# Patient Record
Sex: Male | Born: 1990 | Race: Asian | Hispanic: No | Marital: Single | Smoking: Never smoker
Health system: Southern US, Community
[De-identification: ages and names within clinical notes are randomized; demographics above are authoritative.]

---

## 2014-04-05 ENCOUNTER — Encounter (HOSPITAL_COMMUNITY): Payer: Self-pay | Admitting: Emergency Medicine

## 2014-04-05 ENCOUNTER — Emergency Department (HOSPITAL_COMMUNITY)
Admission: EM | Admit: 2014-04-05 | Discharge: 2014-04-05 | Disposition: A | Discharge status: Home / Self Care | Payer: Self-pay | Attending: Emergency Medicine | Admitting: Emergency Medicine

## 2014-04-05 ENCOUNTER — Emergency Department (HOSPITAL_COMMUNITY): Payer: Self-pay

## 2014-04-05 DIAGNOSIS — S61412A Laceration without foreign body of left hand, initial encounter: Secondary | ICD-10-CM | POA: Insufficient documentation

## 2014-04-05 DIAGNOSIS — Y9289 Other specified places as the place of occurrence of the external cause: Secondary | ICD-10-CM | POA: Insufficient documentation

## 2014-04-05 DIAGNOSIS — W25XXXA Contact with sharp glass, initial encounter: Secondary | ICD-10-CM | POA: Insufficient documentation

## 2014-04-05 DIAGNOSIS — Y9389 Activity, other specified: Secondary | ICD-10-CM | POA: Insufficient documentation

## 2014-04-05 DIAGNOSIS — Y998 Other external cause status: Secondary | ICD-10-CM | POA: Insufficient documentation

## 2014-04-05 LAB — I-STAT CHEM 8, ED
BUN: 8 mg/dL (ref 6–23)
CALCIUM ION: 1.26 mmol/L — AB (ref 1.12–1.23)
CHLORIDE: 102 meq/L (ref 96–112)
CREATININE: 1 mg/dL (ref 0.50–1.35)
GLUCOSE: 84 mg/dL (ref 70–99)
HCT: 47 % (ref 39.0–52.0)
Hemoglobin: 16 g/dL (ref 13.0–17.0)
Potassium: 4.3 mmol/L (ref 3.5–5.1)
Sodium: 139 mmol/L (ref 135–145)
TCO2: 23 mmol/L (ref 0–100)

## 2014-04-05 MED ORDER — HYDROCODONE-ACETAMINOPHEN 5-325 MG PO TABS: 1.0000 | ORAL_TABLET | Freq: Four times a day (QID) | ORAL | Status: AC | PRN

## 2014-04-05 MED ORDER — AMOXICILLIN-POT CLAVULANATE 875-125 MG PO TABS: 1.0000 | ORAL_TABLET | Freq: Two times a day (BID) | ORAL | Status: AC

## 2014-04-05 MED ORDER — LIDOCAINE HCL (PF) 1 % IJ SOLN
5.0000 mL | Freq: Once | INTRAMUSCULAR | Status: AC
Start: 2014-04-05 — End: 2014-04-05
  Administered 2014-04-05: 5 mL
  Filled 2014-04-05: qty 5

## 2014-04-05 NOTE — ED Notes (Signed)
PA at bedside to speak with patient.  

## 2014-04-05 NOTE — Discharge Instructions (Signed)
Keep wound and clean with mild soap and water. Keep area covered with a topical antibiotic ointment and bandage with slight pressure until the bleeding has completely subsided, keep bandage dry, and do not submerge in water for 24 hours. Do not go swimming until the wound closes. Ice and elevate for additional pain relief and swelling. Use percocet as directed as needed pain relief but don't drive while taking this medication. [facility] for wound recheck and with the hand specialist listed above in 7 days for suture removal and ongoing care of any further management of your hand injury. Take your antibiotics as directed. Monitor area for signs of infection to include, but not limited to: increasing pain, redness, drainage/pus, or swelling. Return to emergency department for emergent changing or worsening symptoms.    Laceration Care, Adult A laceration is a cut that goes through all layers of the skin. The cut goes into the tissue beneath the skin. HOME CARE For stitches (sutures) or staples:  Keep the cut clean and dry.  If you have a bandage (dressing), change it at least once a day. Change the bandage if it gets wet or dirty, or as told by your doctor.  Wash the cut with soap and water 2 times a day. Rinse the cut with water. Pat it dry with a clean towel.  Put a thin layer of medicated cream on the cut as told by your doctor.  You may shower after the first 24 hours. Do not soak the cut in water until the stitches are removed.  Only take medicines as told by your doctor.  Have your stitches or staples removed as told by your doctor. For skin adhesive strips:  Keep the cut clean and dry.  Do not get the strips wet. You may take a bath, but be careful to keep the cut dry.  If the cut gets wet, pat it dry with a clean towel.  The strips will fall off on their own. Do not remove the strips that are still stuck to the cut. For  wound glue:  You may shower or take baths. Do not soak or scrub the cut. Do not swim. Avoid heavy sweating until the glue falls off on its own. After a shower or bath, pat the cut dry with a clean towel.  Do not put medicine on your cut until the glue falls off.  If you have a bandage, do not put tape over the glue.  Avoid lots of sunlight or tanning lamps until the glue falls off. Put sunscreen on the cut for the first year to reduce your scar.  The glue will fall off on its own. Do not pick at the glue. You may need a tetanus shot if:  You cannot remember when you had your last tetanus shot.  You have never had a tetanus shot. If you need a tetanus shot and you choose not to have one, you may get tetanus. Sickness from tetanus can be serious. GET HELP RIGHT AWAY IF:   Your pain does not get better with medicine.  Your arm, hand, leg, or foot loses feeling (numbness) or changes color.  Your cut is bleeding.  Your joint feels weak, or you cannot use your joint.  You have painful lumps on your body.  Your cut is red, puffy (swollen), or painful.  You have a red line on the skin near the cut.  You have yellowish-white  fluid (pus) coming from the cut.  You have a fever.  You have a bad smell coming from the cut or bandage.  Your cut breaks open before or after stitches are removed.  You notice something coming out of the cut, such as wood or glass.  You cannot move a finger or toe. MAKE SURE YOU:   Understand these instructions.  Will watch your condition.  Will get help right away if you are not doing well or get worse. Document Released: 09/12/2007 Document Revised: 06/18/2011 Document Reviewed: 09/19/2010 [facility] Patient Information 2015 [place], [place]. This information is not intended to replace advice given to you by your health care provider. Make sure you discuss any questions you have with your health care provider.  Sutured Wound Care Sutures are  stitches that can be used to close wounds. Caring for your wound can help stop infection and lessen pain. HOME CARE   Rest and raise (elevate) the injured area until the pain and puffiness (swelling) go away.  Only take medicines as told by your doctor.  Clean the wound gently with mild soap and water once a day after the first 2 days. Rinse off the soap. Pat the area dry with a clean towel. Do not rub the wound.  Change the bandage (dressing) as told by your doctor. If the bandage sticks, soak it off with soapy water. Stop using a bandage after 2 days or after the wound stops leaking fluid.  Put cream on the wound as told by your doctor.  Do not stretch the wound.  Drink enough fluids to keep your pee (urine) clear or pale yellow.  See your doctor to have the sutures removed.  Use sunscreen or sunblock on the wound after it heals. GET HELP RIGHT AWAY IF:   Your wound gets red, puffy, hot, or tender.  You have more pain in the wound.  You have a red streak that goes away from the wound.  You see yellowish-white fluid (pus) coming out of the wound.  You have a fever.  You have chills and start to shake.  You notice a bad smell coming from the wound.  Your wound will not stop bleeding. MAKE SURE YOU:   Understand these instructions.  Will watch your condition.  Will get help right away if you are not doing well or get worse. Document Released: 09/12/2007 Document Revised: 06/18/2011 Document Reviewed: 07/30/2010 [facility] Patient Information 2015 [place], [place]. This information is not intended to replace advice given to you by your health care provider. Make sure you discuss any questions you have with your health care provider.  Wound Care Wound care helps prevent pain and infection.  You may need a tetanus shot if:  You cannot remember when you had your last tetanus shot.  You have never had a tetanus shot.  The injury broke your skin. If you need a tetanus  shot and you choose not to have one, you may get tetanus. Sickness from tetanus can be serious. HOME CARE   Only take medicine as told by your doctor.  Clean the wound daily with mild soap and water.  Change any bandages (dressings) as told by your doctor.  Put medicated cream and a bandage on the wound as told by your doctor.  Change the bandage if it gets wet, dirty, or starts to smell.  Take showers. Do not take baths, swim, or do anything that puts your wound under water.  Rest and raise (elevate) the wound  until the pain and puffiness (swelling) are better.  Keep all doctor visits as told. GET HELP RIGHT AWAY IF:   Yellowish-white fluid (pus) comes from the wound.  Medicine does not lessen your pain.  There is a red streak going away from the wound.  You have a fever. MAKE SURE YOU:   Understand these instructions.  Will watch your condition.  Will get help right away if you are not doing well or get worse. Document Released: 01/03/2008 Document Revised: 06/18/2011 Document Reviewed: 07/30/2010 [facility] Patient Information 2015 [place]. This information is not intended to replace advice given to you by your health care provider. Make sure you discuss any questions you have with your health care provider.

## 2014-04-05 NOTE — ED Notes (Signed)
PA at bedside to work on laceration.

## 2014-04-05 NOTE — ED Provider Notes (Signed)
CSN: [id number]     Arrival date & time 04/05/14  1105 History  This chart was scribed for non-physician practitioner, Allen DerryMercedes Camprubi-Soms, PA-C working with Merrie RoofJohn David Wofford III, MD by Greggory StallionKayla Andersen, ED scribe. This patient was seen in room TR11C/TR11C and the patient's care was started at 12:33 PM.    Chief Complaint  Patient presents with  . Extremity Laceration   Patient is a 23 y.o. male presenting with skin laceration. The history is provided by the patient. No language interpreter was used.  Laceration Location:  Hand Hand laceration location:  L hand Bleeding: venous and uncontrolled   Time since incident:  19 hours Laceration mechanism:  Broken glass Pain details:    Quality:  Unable to specify   Severity:  Mild   Timing:  Intermittent   Progression:  Unchanged Relieved by:  Pressure Worsened by:  Nothing tried Ineffective treatments:  None tried Tetanus status:  Up to date  HPI Comments: Angel Huang is a 23 y.o. male who presents to the Emergency Department complaining of a laceration to his left hand that occurred yesterday around 5 PM. States he was washing a glass when it broke in his hand. He has put pressure over the area but reports continued bleeding, states it's oozing. Reports mild pain over the area when pressure is released but he is unable to further specify the pain. Denies chest pain, SOB, dizziness, numbness, tingling, weakness. Denies history of bleeding conditions. Pt is not currently on blood thinners. His last tetanus was last year. Denies allergies to medications.  History reviewed. No pertinent past medical history. History reviewed. No pertinent past surgical history. No family history on file. History  Substance Use Topics  . Smoking status: Never Smoker   . Smokeless tobacco: Not on file  . Alcohol Use: No    Review of Systems  Respiratory: Negative for shortness of breath.   Cardiovascular: Negative for chest pain.  Musculoskeletal:  Negative for myalgias, joint swelling and arthralgias.  Skin: Positive for wound.  Neurological: Negative for dizziness, syncope, weakness, light-headedness and numbness.  Hematological: Does not bruise/bleed easily.  10 systems reviewed and are negative for acute changes except as noted in the HPI.  Allergies  Review of patient's allergies indicates no known allergies.  Home Medications   Prior to Admission medications   Not on File   BP 121/73 mmHg  Pulse 76  Temp(Src) 98.7 F (37.1 C) (Oral)  SpO2 100%   Physical Exam  Constitutional: He is oriented to person, place, and time. Vital signs are normal. He appears well-developed and well-nourished.  Non-toxic appearance. No distress.  VSS, well appearing  HENT:  Head: Normocephalic and atraumatic.  Mouth/Throat: Mucous membranes are normal.  Eyes: Conjunctivae and EOM are normal. Right eye exhibits no discharge. Left eye exhibits no discharge.  Neck: Normal range of motion. Neck supple.  Cardiovascular: Normal rate and intact distal pulses.   Distal pulses intact, cap refill brisk and present in all digits  Pulmonary/Chest: Effort normal. No respiratory distress.  Abdominal: Normal appearance. He exhibits no distension.  Musculoskeletal: Normal range of motion.       Left hand: He exhibits laceration. He exhibits normal range of motion, no tenderness, normal two-point discrimination and normal capillary refill. Normal sensation noted. Normal strength noted.       Hands: L hand with ~3cm lac just proximal to MCP joint of 2nd digit, extending to just overtop of the joint, with nonpulsatile oozing bleeding. ROM intact at  MCP, PIP, and DIP joints. Sensation grossly intact in all aspects of digits, wiggles all digits, cap refill brisk and present. Strength 5/5 in all extremities. Once tourniquet was applied, bleeding ceased and wound was explored, extends down to extensor tendon without interruption to tendon sheath. No FBs visualized.   Neurological: He is alert and oriented to person, place, and time. He has normal strength. No sensory deficit.  Skin: Skin is warm and dry. Laceration noted.  Left hand laceration as noted above  Psychiatric: He has a normal mood and affect. His behavior is normal.  Nursing note and vitals reviewed.   ED Course  LACERATION REPAIR Date/Time: 04/05/2014 2:28 PM Performed by: Marjean DonnaAMPRUBI-SOMS, Cira Deyoe STRUPP Authorized by: Ramond MarrowAMPRUBI-SOMS, Lenford Beddow STRUPP Consent: Verbal consent obtained. Risks and benefits: risks, benefits and alternatives were discussed Consent given by: patient Patient understanding: patient states understanding of the procedure being performed Patient consent: the patient's understanding of the procedure matches consent given Patient identity confirmed: verbally with patient Body area: upper extremity Location details: left hand Laceration length: 3 cm Foreign bodies: no foreign bodies Tendon involvement: none Nerve involvement: none Vascular damage: yes (venous) Anesthesia: local infiltration Local anesthetic: lidocaine 1% without epinephrine Anesthetic total: 1 ml Patient sedated: no Preparation: Patient was prepped and draped in the usual sterile fashion. Irrigation solution: saline Irrigation method: syringe Amount of cleaning: extensive Debridement: none Degree of undermining: none Skin closure: 4-0 Prolene Number of sutures: 4 Technique: simple Approximation: loose Approximation difficulty: simple Dressing: 4x4 sterile gauze Patient tolerance: Patient tolerated the procedure well with no immediate complications Comments: Loose closure of 3cm lac with 4 sutures to allow for improved hemostasis. 1L saline irrigation used. Wound bed explored extensively without retained FBs noted.   (including critical care time)  DIAGNOSTIC STUDIES: Oxygen Saturation is 100% on RA, normal by my interpretation.    COORDINATION OF CARE: 12:36 PM-Discussed treatment  plan which includes hand xray and cleaning wound with pt at bedside and pt agreed to plan. Advised pt lacerations are not normally repaired after this long.  12:38 PM-Discussed pt with Dr. Loretha StaplerWofford. Advised compression dressing and to re-evaluate after one hour. If wound is still bleeding, advised repair and antibiotics.   2:28 PM-Bleeding unable to be controlled with pressure. Will repair laceration at this time. 1 mL 1% lidocaine without epi used. 4-0 Prolene used to close wound with 4 loose sutures. Risks to suturing lacerations after this long were discussed with pt and he is agreeable to plan. Will discharge with an antibiotic.   Labs Review Labs Reviewed  I-STAT CHEM 8, ED - Abnormal; Notable for the following:    Calcium, Ion 1.26 (*)    All other components within normal limits    Imaging Review Dg Hand Complete Left  04/05/2014   CLINICAL DATA:  Left hand laceration. Question foreign body/glass. Initial encounter.  EXAM: LEFT HAND - COMPLETE 3+ VIEW  COMPARISON:  None.  FINDINGS: There is an overlying dressing in the region of the metacarpophalangeal joints. No radiopaque foreign body. No fracture. No erosion or periosteal reaction. The alignment is maintained.  IMPRESSION: No radiopaque foreign body or acute osseous abnormality.   Electronically Signed   By: Rubye OaksMelanie  Ehinger M.D.   On: 04/05/2014 13:06     EKG Interpretation None      MDM   Final diagnoses:  Hand laceration, left, initial encounter    23 y.o. male with laceration to L hand, volar aspect, approx 3cm in length, which occurred >12hrs ago but <  24hrs and is continuing to bleed. Pt states he's UTD on tetanus. Istat obtained to eval for anemia, which was neg. Obtained xray which showed no acute fracture or retained FB. Applied pressure dressing for ~25 minutes to attempt to control bleeding which failed. Proceeded with loose repair with tourniquet to help control bleeding while area was irrigated with 1L saline. Wound  extends down to extensor tendon but does not interrupt tendon. Loose repair with 4 sutures, wound hemostatic after this, but open enough still to allow for drainage. Started on abx. Pt declined pain meds here, stated he wasn't in pain. Given norco rx and augmentin. Will have him f/up with urgent care in 2 days for wound recheck given the high risk of infection in this wound due to time that it was open. Will have him f/up with hand surgeon in 1wk for suture removal and ongoing management of wound/injury. I explained the diagnosis and have given explicit precautions to return to the ER including for any other new or worsening symptoms. The patient understands and accepts the medical plan as it's been dictated and I have answered their questions. Discharge instructions concerning home care and prescriptions have been given. The patient is STABLE and is discharged to home in good condition.  BP 130/76 mmHg  Pulse 71  Temp(Src) 97.3 F (36.3 C) (Oral)  Resp 16  SpO2 100%  Meds ordered this encounter  Medications  . lidocaine (PF) (XYLOCAINE) 1 % injection 5 mL    Sig:   . HYDROcodone-acetaminophen (NORCO) 5-325 MG per tablet    Sig: Take 1 tablet by mouth every 6 (six) hours as needed for severe pain.    Dispense:  6 tablet    Refill:  0    Order Specific Question:  Supervising Provider    Answer:  Eber Hong D [[id number]]  . amoxicillin-clavulanate (AUGMENTIN) 875-125 MG per tablet    Sig: Take 1 tablet by mouth 2 (two) times daily. One po bid x 7 days    Dispense:  14 tablet    Refill:  0    Order Specific Question:  Supervising Provider    Answer:  Eber Hong D [[id number]]     I personally performed the services described in this documentation, which was scribed in my presence. The recorded information has been reviewed and is accurate.  Donnita Falls [place], [place] 04/05/14 1513  Merrie Roof, MD 04/05/14 [phone]

## 2014-04-05 NOTE — ED Notes (Signed)
Laceration to left hand with broken drinking glass. This happened yesterday around 1700. Wound continues to bleed when dressing removed.

## 2014-04-08 ENCOUNTER — Encounter (HOSPITAL_COMMUNITY): Payer: Self-pay | Admitting: Emergency Medicine

## 2014-04-08 ENCOUNTER — Emergency Department (HOSPITAL_COMMUNITY)
Admission: EM | Admit: 2014-04-08 | Discharge: 2014-04-08 | Disposition: A | Discharge status: Home / Self Care | Payer: Self-pay | Attending: Emergency Medicine | Admitting: Emergency Medicine

## 2014-04-08 DIAGNOSIS — Z4801 Encounter for change or removal of surgical wound dressing: Secondary | ICD-10-CM | POA: Insufficient documentation

## 2014-04-08 DIAGNOSIS — Z79899 Other long term (current) drug therapy: Secondary | ICD-10-CM | POA: Insufficient documentation

## 2014-04-08 DIAGNOSIS — Z5189 Encounter for other specified aftercare: Secondary | ICD-10-CM

## 2014-04-08 NOTE — Discharge Instructions (Signed)
Sutured Wound Care °Sutures are stitches that can be used to close wounds. Wound care helps prevent pain and infection.  °HOME CARE INSTRUCTIONS  °· Rest and elevate the injured area until all the pain and swelling are gone. °· Only take over-the-counter or prescription medicines for pain, discomfort, or fever as directed by your caregiver. °· After 48 hours, gently wash the area with mild soap and water once a day, or as directed. Rinse off the soap. Pat the area dry with a clean towel. Do not rub the wound. This may cause bleeding. °· Follow your caregiver's instructions for how often to change the bandage (dressing). Stop using a dressing after 2 days or after the wound stops draining. °· If the dressing sticks, moisten it with soapy water and gently remove it. °· Apply ointment on the wound as directed. °· Avoid stretching a sutured wound. °· Drink enough fluids to keep your urine clear or pale yellow. °· Follow up with your caregiver for suture removal as directed. °· Use sunscreen on your wound for the next 3 to 6 months so the scar will not darken. °SEEK IMMEDIATE MEDICAL CARE IF:  °· Your wound becomes red, swollen, hot, or tender. °· You have increasing pain in the wound. °· You have a red streak that extends from the wound. °· There is pus coming from the wound. °· You have a fever. °· You have shaking chills. °· There is a bad smell coming from the wound. °· You have persistent bleeding from the wound. °MAKE SURE YOU:  °· Understand these instructions. °· Will watch your condition. °· Will get help right away if you are not doing well or get worse. °Document Released: 05/03/2004 Document Revised: 06/18/2011 Document Reviewed: 07/30/2010 °Exit[employer]® Patient Information ©2015 [employer]. This information is not intended to replace advice given to you by your health care provider. Make sure you discuss any questions you have with your health care provider. ° °

## 2014-04-08 NOTE — ED Notes (Signed)
Here 12/26 for lac to left hand. Had been over 12 hours since accident, so were not able to suture. Had problem with continued bleeding so a few sutures were applied. Pt reports not further bleeding. Sutures intact, wound w/o signs of infection.

## 2014-04-08 NOTE — ED Provider Notes (Signed)
CSN: [id number]     Arrival date & time 04/08/14  0911 History   This chart was scribed for non-physician practitioner working with Rolland PorterMark James, MD, by Jarvis Morganaylor Ferguson, ED Scribe. This patient was seen in room TR06C/TR06C and the patient's care was started at 9:28 AM.     Chief Complaint  Patient presents with  . Wound Check    The history is provided by the patient. No language interpreter was used.    HPI Comments: Angel Huang is a 23 y.o. male who presents to the Emergency Department for a follow up of a laceration to his left hand just below his 2nd finger. He states he originally cut the area with broken glass. Pt presented to the ED 3 days ago and 4 sutures placed in his left hand. He was told to return in 2-3 days for wound recheck because he wound had to be sutured after 12 hours of being open due to the fact that they were unable to get it to stop bleeding. Pt denies any pain to the area. He denies any fever, nausea, vomiting, diarrhea, color change, or drainage.  History reviewed. No pertinent past medical history. History reviewed. No pertinent past surgical history. No family history on file. History  Substance Use Topics  . Smoking status: Never Smoker   . Smokeless tobacco: Not on file  . Alcohol Use: No    Review of Systems  Skin: Positive for wound (laceration with 4 sutures to left hand).  All other systems reviewed and are negative.     Allergies  Review of patient's allergies indicates no known allergies.  Home Medications   Prior to Admission medications   Medication Sig Start Date End Date Taking? Authorizing Provider  amoxicillin-clavulanate (AUGMENTIN) 875-125 MG per tablet Take 1 tablet by mouth 2 (two) times daily. One po bid x 7 days 04/05/14   Donnita FallsMercedes Strupp Camprubi-Soms, PA-C  HYDROcodone-acetaminophen (NORCO) 5-325 MG per tablet Take 1 tablet by mouth every 6 (six) hours as needed for severe pain. 04/05/14   Mercedes Strupp Camprubi-Soms, PA-C    Triage Vitals: BP 132/76 mmHg  Pulse 60  Temp(Src) 98.3 F (36.8 C) (Oral)  Resp 16  SpO2 98%  Physical Exam  Constitutional: He is oriented to person, place, and time. He appears well-developed and well-nourished. No distress.  HENT:  Head: Normocephalic and atraumatic.  Eyes: Conjunctivae and EOM are normal.  Neck: Neck supple. No tracheal deviation present.  Cardiovascular: Normal rate.   Pulmonary/Chest: Effort normal. No respiratory distress.  Musculoskeletal: Normal range of motion.       Hands: Neurological: He is alert and oriented to person, place, and time.  Skin: Skin is warm and dry.  Psychiatric: He has a normal mood and affect. His behavior is normal.  Nursing note and vitals reviewed.   ED Course  Procedures (including critical care time)  DIAGNOSTIC STUDIES: Oxygen Saturation is 98% on RA, normal by my interpretation.    COORDINATION OF CARE: 9:26 AM changed wound dressing. Wound appears to be healing well, no systemic symptoms. Normal Vitals. Sutures will need to be removed at day 7-10. They have been advised that Monday or Tuesday. Continue to keep wound dry and clean. Pt has not questions at this time.Pt advised of plan for treatment and pt agrees.  Labs Review Labs Reviewed - No data to display  Imaging Review No results found.   EKG Interpretation None     MDM   Final diagnoses:  Visit for  wound check   23 y.o.Angel Huang's evaluation in the Emergency Department is complete. It has been determined that no acute conditions requiring further emergency intervention are present at this time. The patient/guardian have been advised of the diagnosis and plan. We have discussed signs and symptoms that warrant return to the ED, such as changes or worsening in symptoms.  Vital signs are stable at discharge. Filed Vitals:   04/08/14 0920  BP: 132/76  Pulse: 60  Temp: 98.3 F (36.8 C)  Resp: 16    Patient/guardian has voiced understanding and  agreed to follow-up with the PCP or specialist.  I personally performed the services described in this documentation, which was scribed in my presence. The recorded information has been reviewed and is accurate.   Dorthula Matasiffany G Gelena Klosinski, PA-C 04/08/14 0932  Rolland PorterMark James, MD 04/10/14 [phone]

## 2014-04-12 ENCOUNTER — Encounter (HOSPITAL_COMMUNITY): Payer: Self-pay | Admitting: Cardiology

## 2014-04-12 ENCOUNTER — Emergency Department (HOSPITAL_COMMUNITY)
Admission: EM | Admit: 2014-04-12 | Discharge: 2014-04-12 | Disposition: A | Discharge status: Home / Self Care | Payer: Self-pay | Attending: Emergency Medicine | Admitting: Emergency Medicine

## 2014-04-12 DIAGNOSIS — Z4802 Encounter for removal of sutures: Secondary | ICD-10-CM | POA: Insufficient documentation

## 2014-04-12 DIAGNOSIS — Z79899 Other long term (current) drug therapy: Secondary | ICD-10-CM | POA: Insufficient documentation

## 2014-04-12 DIAGNOSIS — Z792 Long term (current) use of antibiotics: Secondary | ICD-10-CM | POA: Insufficient documentation

## 2014-04-12 NOTE — ED Notes (Signed)
Pt reports that he is here to have the sutures removed from his hand

## 2014-04-12 NOTE — Discharge Instructions (Signed)
Please follow the directions provided. Keep your wound clean and dry.  Don't hesitate to return for any new, worsening, or concerning symptoms.  SEEK MEDICAL CARE IF:  You have increasing redness, swelling, or pain in the wound.  You see pus coming from the wound.  You have a fever.  You notice a bad smell coming from the wound or dressing.  Your wound breaks open (edges not staying together).

## 2014-04-12 NOTE — ED Provider Notes (Signed)
CSN: [id number]     Arrival date & time 04/12/14  1310 History  This chart was scribed for non-physician practitioner, Donetta Potts, NP working with Geoffery Lyons, MD, by Jarvis Morgan, ED Scribe. This patient was seen in room TR08C/TR08C and the patient's care was started at 2:01 PM.    Chief Complaint  Patient presents with  . Suture / Staple Removal    The history is provided by the patient and a friend. No language interpreter was used.    HPI Comments: Angel Huang is a 24 y.o. male who presents to the Emergency Department for a follow up of a laceration to his left hand just below his 2nd finger. He states he originally cut the area with broken glass. Pt presented to the ED 5 days ago to have his stitches taken out but was told he needed to wait 5-6 more days before the 4 sutures could be removed. Pt denies any pain to the area. He denies any fever, nausea, vomiting, diarrhea, color change, or drainage.   History reviewed. No pertinent past medical history. History reviewed. No pertinent past surgical history. History reviewed. No pertinent family history. History  Substance Use Topics  . Smoking status: Never Smoker   . Smokeless tobacco: Not on file  . Alcohol Use: No    Review of Systems  Constitutional: Negative for fever.  Gastrointestinal: Negative for nausea, vomiting and diarrhea.  Skin: Positive for wound (well healed lac to left 2nd finger). Negative for color change.      Allergies  Review of patient's allergies indicates no known allergies.  Home Medications   Prior to Admission medications   Medication Sig Start Date End Date Taking? Authorizing Provider  amoxicillin-clavulanate (AUGMENTIN) 875-125 MG per tablet Take 1 tablet by mouth 2 (two) times daily. One po bid x 7 days 04/05/14   Donnita Falls Camprubi-Soms, PA-C  HYDROcodone-acetaminophen (NORCO) 5-325 MG per tablet Take 1 tablet by mouth every 6 (six) hours as needed for severe pain. 04/05/14    Mercedes Strupp Camprubi-Soms, PA-C   Triage Vitals: BP 130/74 mmHg  Pulse 83  Temp(Src) 98.2 F (36.8 C) (Oral)  Resp 18  Ht  (1.778 m)  SpO2 100%  Physical Exam  Constitutional: He is oriented to person, place, and time. He appears well-developed and well-nourished. No distress.  HENT:  Head: Normocephalic and atraumatic.  Eyes: Conjunctivae and EOM are normal.  Neck: Neck supple. No tracheal deviation present.  Cardiovascular: Normal rate.   Pulmonary/Chest: Effort normal. No respiratory distress.  Musculoskeletal: Normal range of motion.  Neurological: He is alert and oriented to person, place, and time.  Skin: Skin is warm and dry.  Well healed, well appromixated, wound to dorsal aspect left index finger at MCP. no redness, no drainage, with 4 stitches in place   Psychiatric: He has a normal mood and affect. His behavior is normal.  Nursing note and vitals reviewed.   ED Course  Procedures (including critical care time)  DIAGNOSTIC STUDIES: Oxygen Saturation is 100% on RA, normal by my interpretation.    COORDINATION OF CARE:  2:11 PM  SUTURE REMOVAL Performed by: Harle Battiest, NP Consent: Verbal consent obtained. Patient identity confirmed: provided demographic data Time out: Immediately prior to procedure a "time out" was called to verify the correct patient, procedure, equipment, support staff and site/side marked as required. Location: left index finger at MCP Wound Appearance: clean Sutures/Staples Removed: 4 Patient tolerance: Patient tolerated the procedure well with no immediate complications.  Labs Review Labs Reviewed - No data to display  Imaging Review No results found.   EKG Interpretation None      MDM   Final diagnoses:  Visit for suture removal   24 yo with well-healed laceration presenting for suture removal and wound check as above. Procedure tolerated well. Vitals normal, no signs of infection. Scar minimization &  return precautions given at dc.    I personally performed the services described in this documentation, which was scribed in my presence. The recorded information has been reviewed and is accurate.   Filed Vitals:   04/12/14 1321  BP: 130/74  Pulse: 83  Temp: 98.2 F (36.8 C)  TempSrc: Oral  Resp: 18  Height:  (1.778 m)  SpO2: 100%   Meds given in ED:  Medications - No data to display  Discharge Medication List as of 04/12/2014  2:14 PM       Harle Battiest, NP 04/12/14 1610  Geoffery Lyons, MD 04/13/14 1531

## 2014-04-12 NOTE — ED Notes (Signed)
Declined W/C at D/C and was escorted to lobby by RN. 

## 2015-05-22 IMAGING — DX DG HAND COMPLETE 3+V*L*
3 series · 3 of 3 positions shown · non-contrast
Comparison: None.

CLINICAL DATA: Left hand laceration. Question foreign body/glass.
Initial encounter.

EXAM:
LEFT HAND - COMPLETE 3+ VIEW

[hand pa]
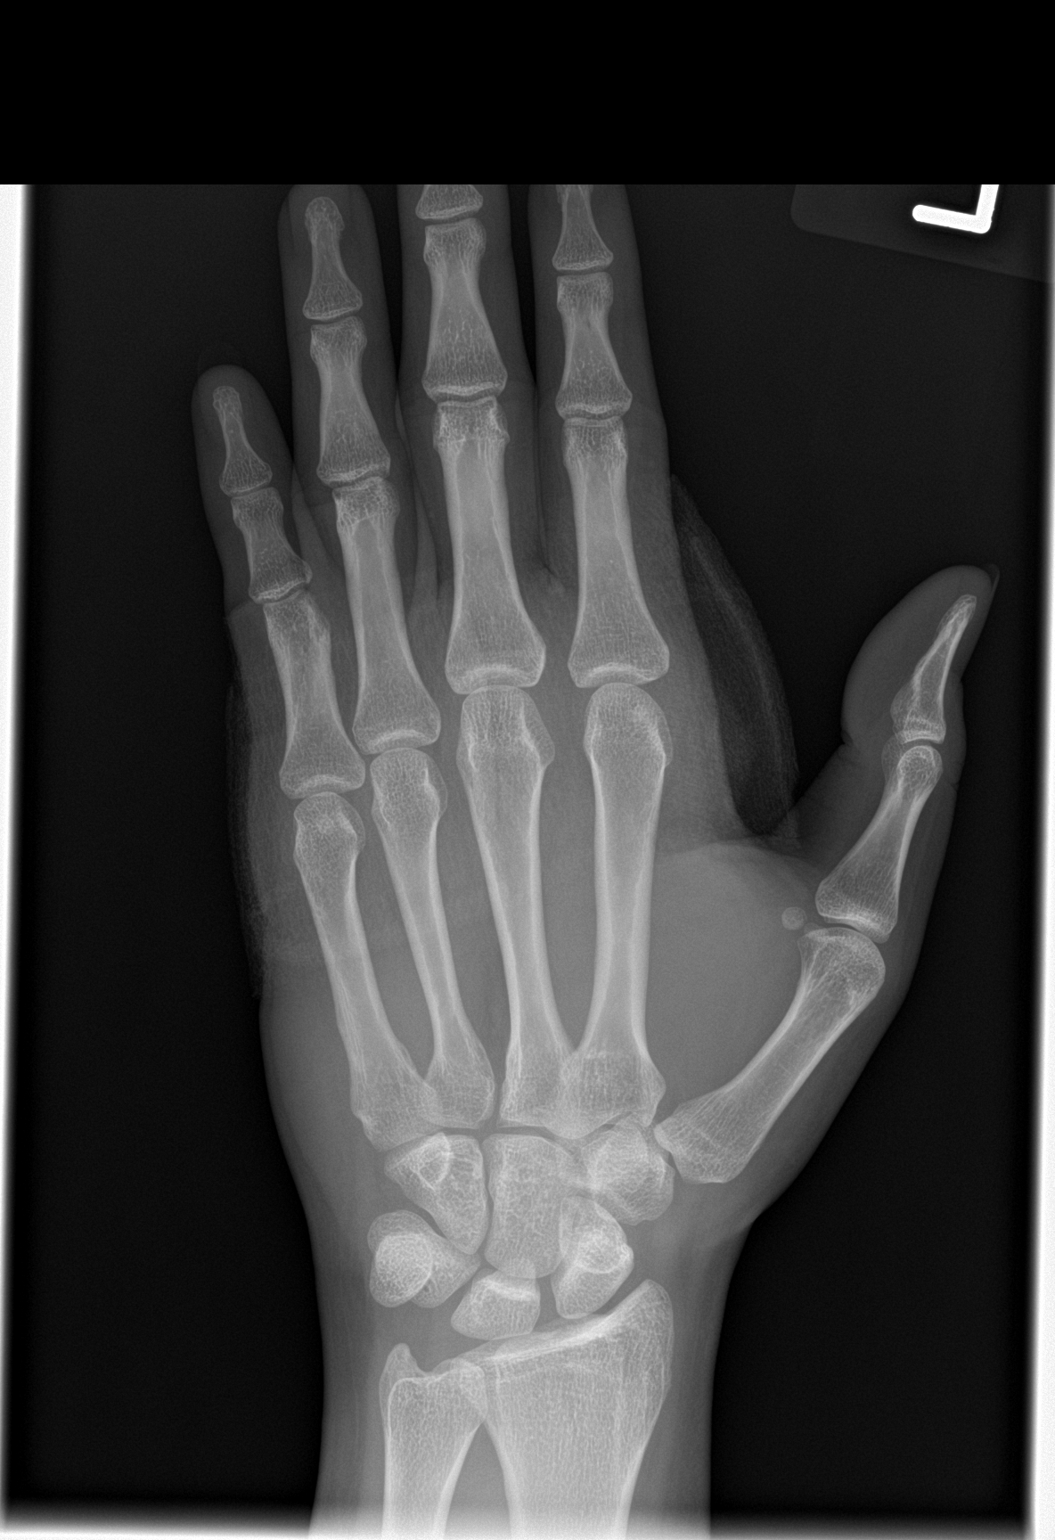

[hand obl]
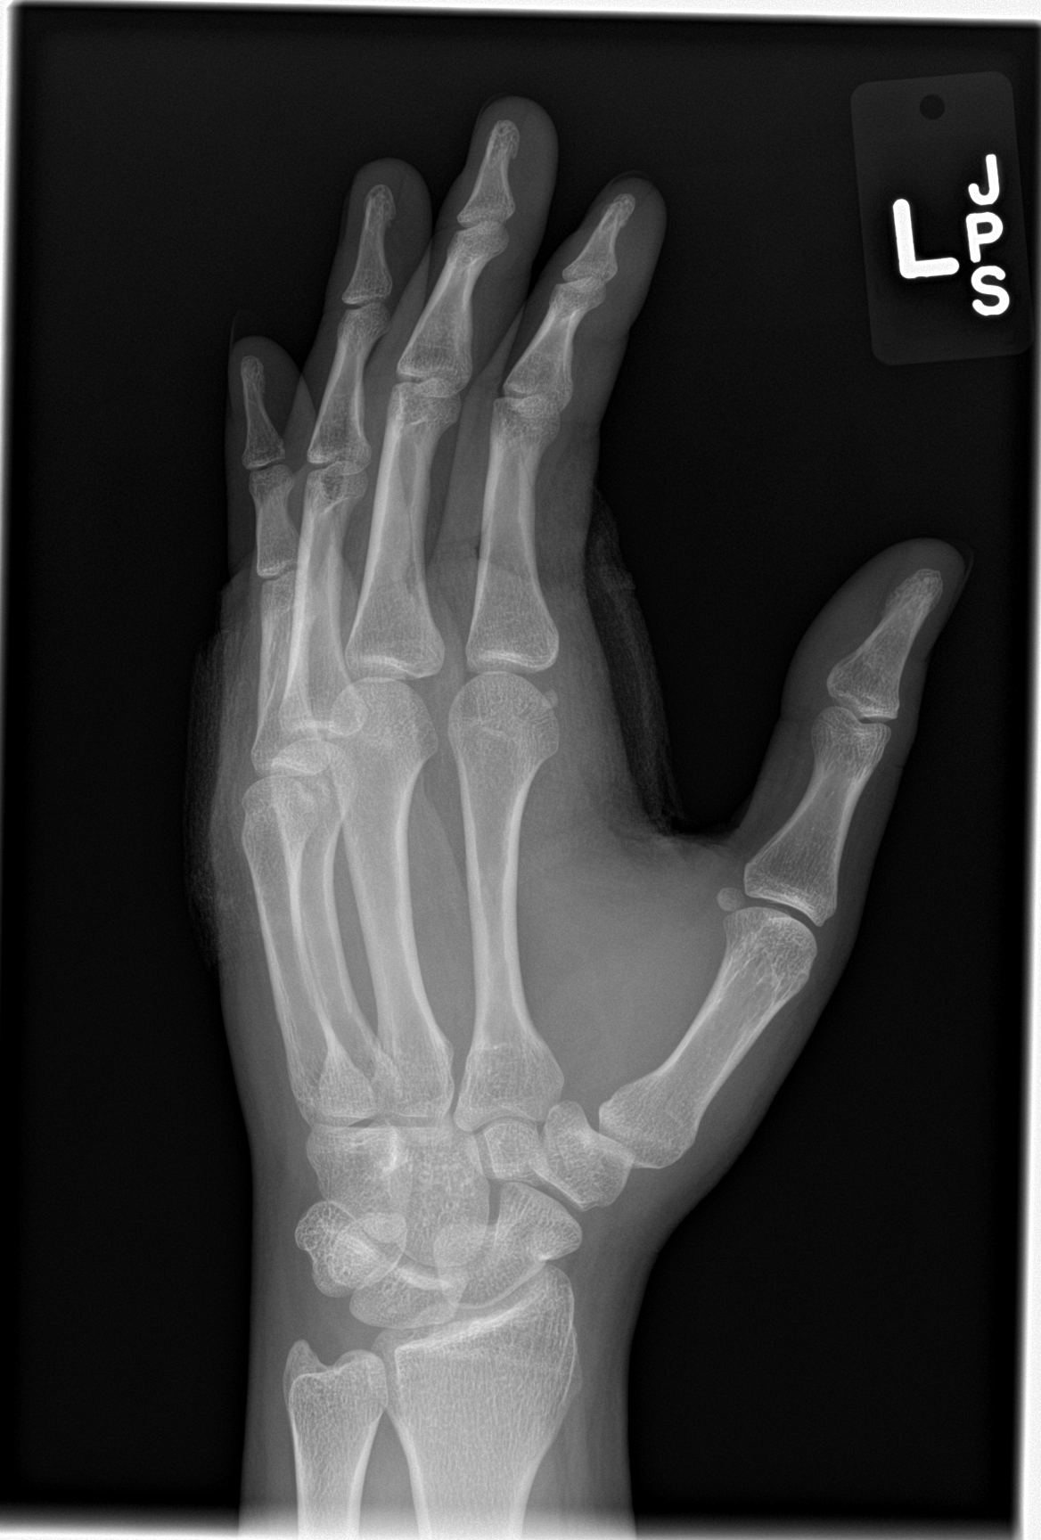

[hand lat]
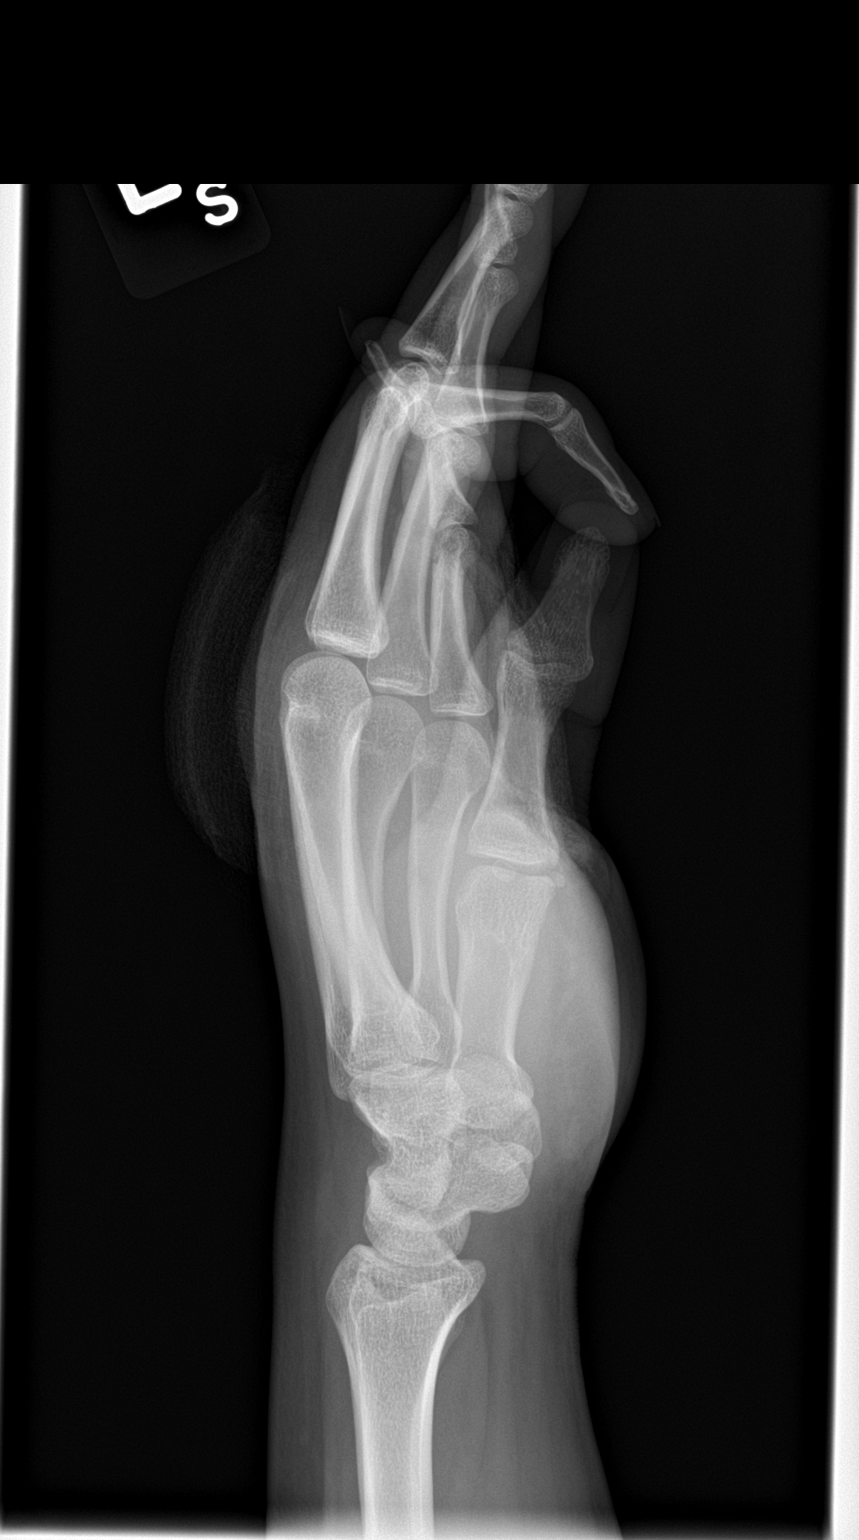

[3 of 3 positions shown; findings below may reference images not displayed]

FINDINGS: There is an overlying dressing in the region of the
metacarpophalangeal joints. No radiopaque foreign body. No fracture.
No erosion or periosteal reaction. The alignment is maintained.
IMPRESSION: No radiopaque foreign body or acute osseous abnormality.
